# Patient Record
Sex: Male | Born: 1985 | Race: White
Health system: Southern US, Community
[De-identification: ages and names within clinical notes are randomized; demographics above are authoritative.]

---

## 2020-05-08 ENCOUNTER — Encounter (HOSPITAL_COMMUNITY): Payer: Self-pay | Admitting: Emergency Medicine

## 2020-05-08 ENCOUNTER — Emergency Department (HOSPITAL_COMMUNITY): Payer: 59

## 2020-05-08 ENCOUNTER — Other Ambulatory Visit: Payer: Self-pay

## 2020-05-08 ENCOUNTER — Emergency Department (HOSPITAL_COMMUNITY)
Admission: EM | Admit: 2020-05-08 | Discharge: 2020-05-08 | Disposition: A | Discharge status: Home / Self Care | Payer: 59 | Attending: Emergency Medicine | Admitting: Emergency Medicine

## 2020-05-08 DIAGNOSIS — Y999 Unspecified external cause status: Secondary | ICD-10-CM | POA: Diagnosis not present

## 2020-05-08 DIAGNOSIS — W1789XA Other fall from one level to another, initial encounter: Secondary | ICD-10-CM | POA: Diagnosis not present

## 2020-05-08 DIAGNOSIS — Y939 Activity, unspecified: Secondary | ICD-10-CM | POA: Insufficient documentation

## 2020-05-08 DIAGNOSIS — Y92812 Truck as the place of occurrence of the external cause: Secondary | ICD-10-CM | POA: Diagnosis not present

## 2020-05-08 DIAGNOSIS — S20212A Contusion of left front wall of thorax, initial encounter: Secondary | ICD-10-CM | POA: Diagnosis not present

## 2020-05-08 DIAGNOSIS — S299XXA Unspecified injury of thorax, initial encounter: Secondary | ICD-10-CM | POA: Diagnosis present

## 2020-05-08 MED ORDER — DICLOFENAC SODIUM 1 % EX GEL: 2.0000 g | Freq: Four times a day (QID) | CUTANEOUS | 0 refills | Status: AC | PRN

## 2020-05-08 NOTE — ED Provider Notes (Signed)
Emergency Department Provider Note   I have reviewed the triage vital signs and the nursing notes.   HISTORY  Chief Complaint Back Pain   HPI Todd Nolan is a 34 y.o. male presents to the emergency department for evaluation of left posterior chest wall pain after falling from a truck 2 days ago.  Patient was standing on the truck bed when he lost his footing and fell landing against a 2 x 4 leaning out of the truck he was in.  He denies any head injury or loss of consciousness.  He said soreness and bruising over the left chest.  He denies any fevers.  He reports pain with taking a deep breath and that he has been taking smaller breaths because of this.  With persistent pain he decided to present to the emergency department.  No sudden worsening symptoms. Pain is moderate and worse with touching/moving the area. No radiation.    History reviewed. No pertinent past medical history.  There are no problems to display for this patient.   History reviewed. No pertinent surgical history.  Allergies Patient has no allergy information on record.  No family history on file.  Social History Social History   Tobacco Use  . Smoking status: Not on file  Substance Use Topics  . Alcohol use: Not Currently  . Drug use: Not on file    Review of Systems  Constitutional: No fever/chills Cardiovascular: Positive chest pain. Respiratory: Denies shortness of breath. Gastrointestinal: No abdominal pain.   Musculoskeletal: Negative for back pain. Positive left chest wall pain.    ____________________________________________   PHYSICAL EXAM:  VITAL SIGNS: ED Triage Vitals  Enc Vitals Group     BP 05/08/20 0857 (!) 140/96     Pulse Rate 05/08/20 0857 85     Resp 05/08/20 0857 16     Temp 05/08/20 0857 97.9 F (36.6 C)     Temp Source 05/08/20 0857 Oral     SpO2 05/08/20 0857 100 %   Constitutional: Alert and oriented. Well appearing and in no acute distress. Eyes:  Conjunctivae are normal. Head: Atraumatic. Nose: No congestion/rhinnorhea. Mouth/Throat: Mucous membranes are moist. Neck: No stridor.  Cardiovascular: Normal rate, regular rhythm. Good peripheral circulation. Grossly normal heart sounds.   Respiratory: Normal respiratory effort.  No retractions. Lungs CTAB. Gastrointestinal: Soft and nontender. No LUQ tenderness specifically. No distention.  Musculoskeletal: No gross deformities of extremities.  Tenderness over the left posterior chest wall with associated mild abrasion.  No crepitus or paradoxical movement.  No lateral chest wall or anterior chest wall discomfort.  Neurologic:  Normal speech and language.  Skin:  Skin is warm, dry and intact. Abrasion to the left posterior chest wall.     ____________________________________________  RADIOLOGY  DG Ribs Unilateral W/Chest Left  Result Date: 05/08/2020 CLINICAL DATA:  34 year old male with a history of fall EXAM: LEFT RIBS AND CHEST - 3+ VIEW COMPARISON:  None. FINDINGS: No fracture or other bone lesions are seen involving the ribs. There is no evidence of pneumothorax or pleural effusion. Both lungs are clear. Heart size and mediastinal contours are within normal limits. IMPRESSION: Negative. Electronically Signed   By: Gilmer Mor D.O.   On: 05/08/2020 09:47    ____________________________________________   PROCEDURES  Procedure(s) performed:   Procedures  None  ____________________________________________   INITIAL IMPRESSION / ASSESSMENT AND PLAN / ED COURSE  Pertinent labs & imaging results that were available during my care of the patient were reviewed by  me and considered in my medical decision making (see chart for details).   Patient presents to the emergency department with left posterior chest wall pain after injury 2 days ago.  He has no anterior abdominal discomfort to suspect splenic or retroperitoneal injury/bleeding.  No flank bruising.  The patient's pain is  isolated over an area of abrasion to the posterior chest wall.  Plain films of the left ribs and chest show no pneumothorax or obvious displaced fracture.  Discussed over-the-counter medications with the patient along with Voltaren which was provided as a prescription.  Patient sent home with incentive spirometer with instructions to use at least once every hour while awake.  Also provided contact information for PCP along with ED return precautions.    ____________________________________________  FINAL CLINICAL IMPRESSION(S) / ED DIAGNOSES  Final diagnoses:  Contusion of rib on left side, initial encounter     NEW OUTPATIENT MEDICATIONS STARTED DURING THIS VISIT:  New Prescriptions   DICLOFENAC SODIUM (VOLTAREN) 1 % GEL    Apply 2 g topically 4 (four) times daily as needed.    Note:  This document was prepared using Dragon voice recognition software and may include unintentional dictation errors.  Alona Bene, MD, [facility]    Slayden Mennenga, Arlyss Repress, MD 05/08/20 1009

## 2020-05-08 NOTE — Discharge Instructions (Signed)
You were seen in the emergency room today with injury to your chest wall.  The x-ray today did not show any broken ribs.  I am you home with an incentive spirometer to use at least once every hour while awake to help prevent developing pneumonia.  Please use over-the-counter Tylenol and/or Motrin as needed for pain along with the topical prescription medicine provided as needed.  Please establish care with a primary care doctor.  Return with any fever, shortness of breath, worsening chest pain, or other sudden severe symptoms.

## 2020-05-08 NOTE — ED Triage Notes (Signed)
Per pt, states he fell onto the bed of a truck about 2 days-complaining of left flank/back pain

## 2021-06-02 IMAGING — CR DG RIBS W/ CHEST 3+V*L*
5 series · 5 of 5 positions shown · non-contrast
Comparison: None.

CLINICAL DATA: 33-year-old male with a history of fall

EXAM:
LEFT RIBS AND CHEST - 3+ VIEW

[w chest pa]
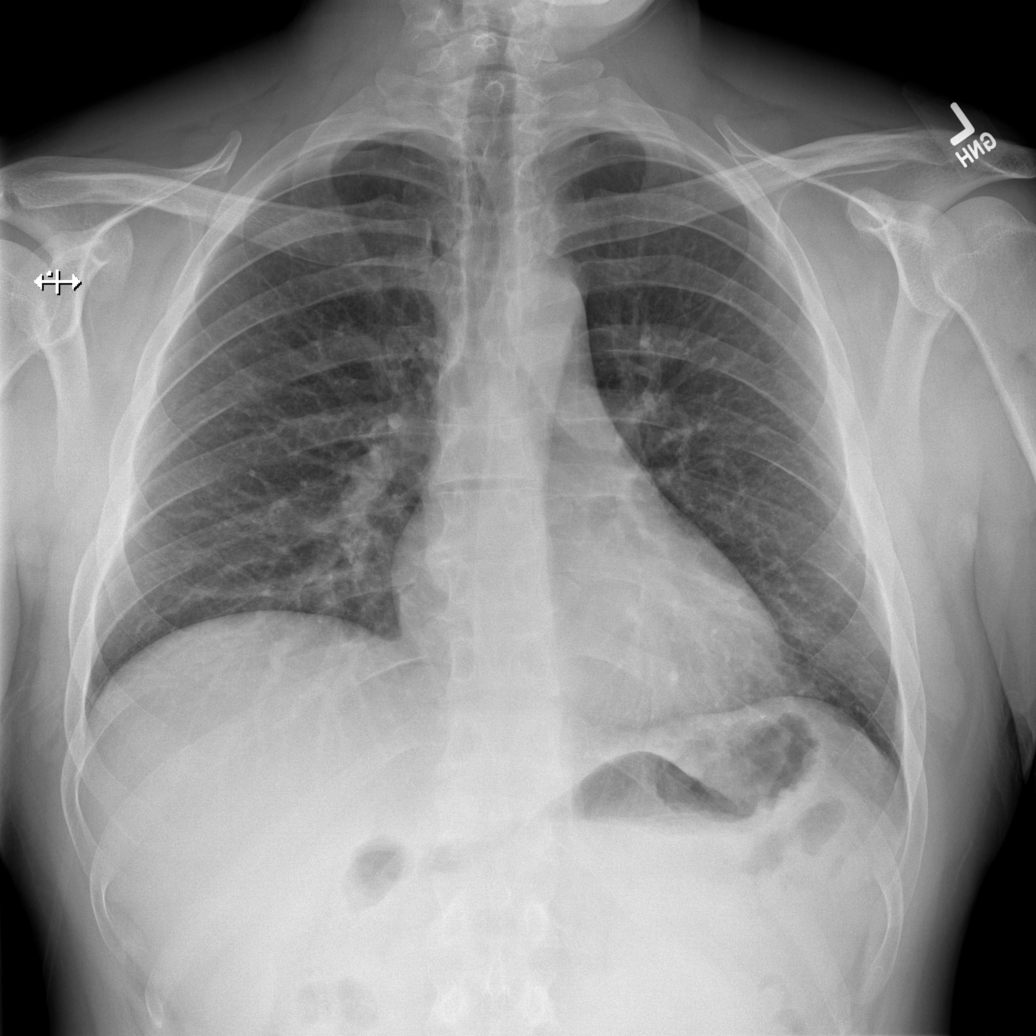

[w ribs ap upper left]
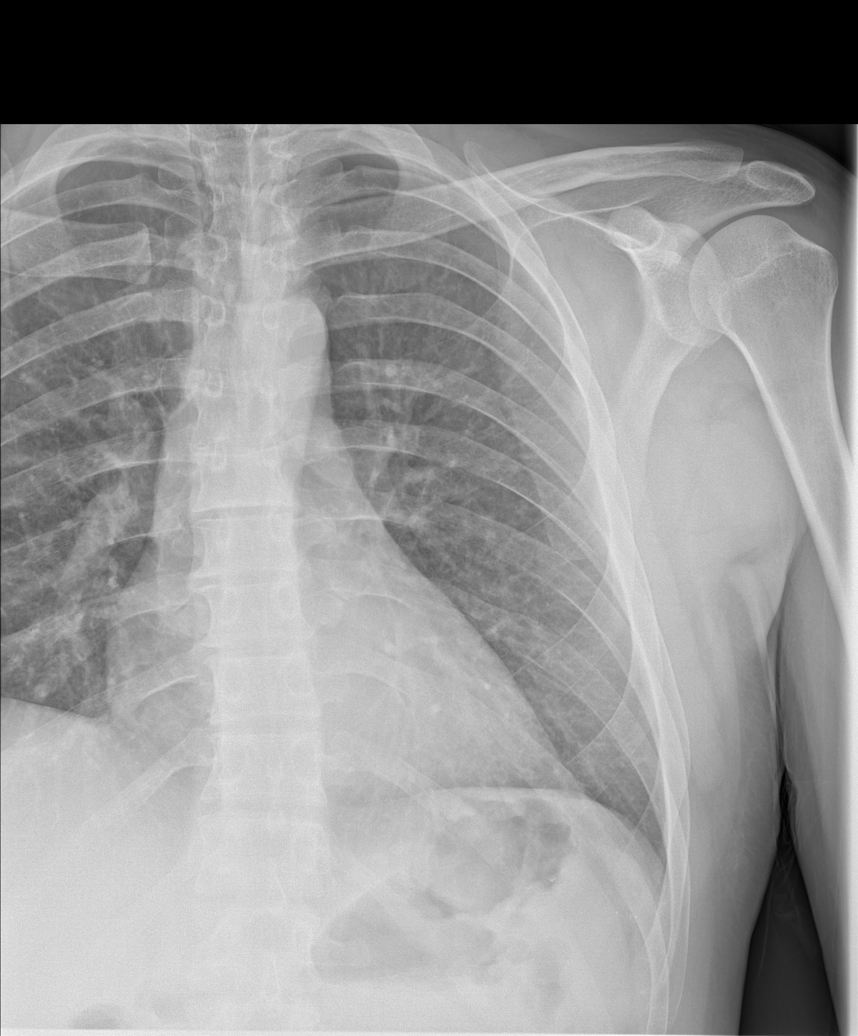

[w ribs ap lower left]
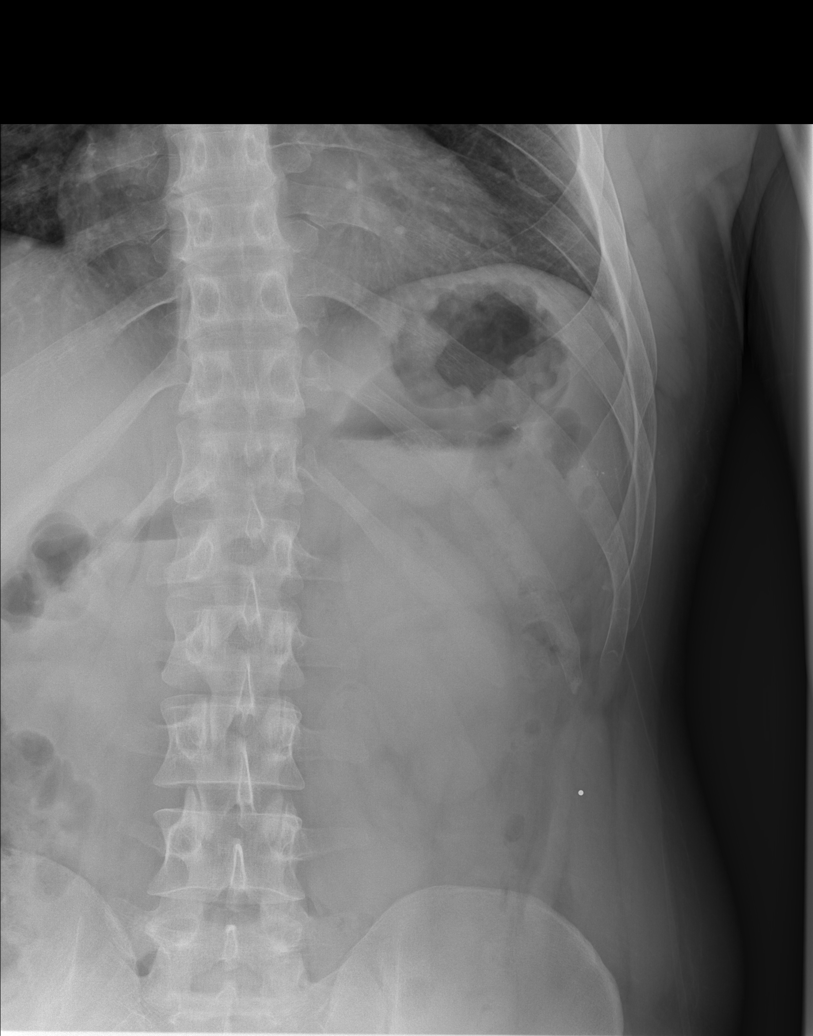

[w ribs obl left (1 of 2)]
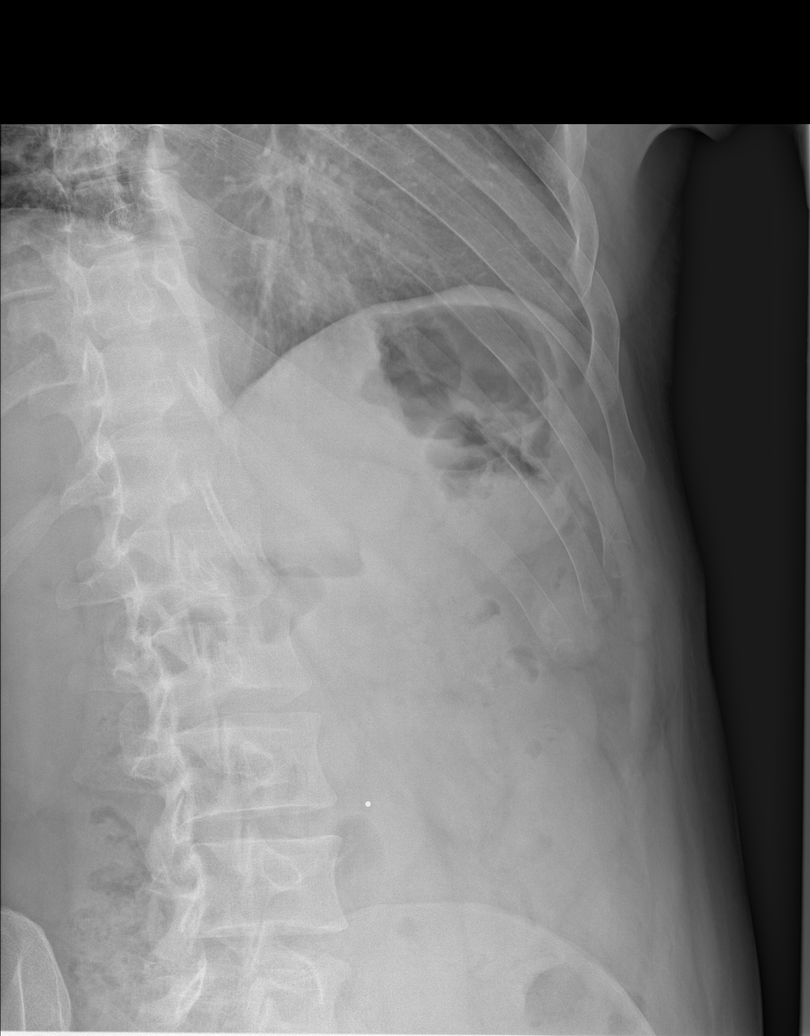

[w ribs obl left (2 of 2)]
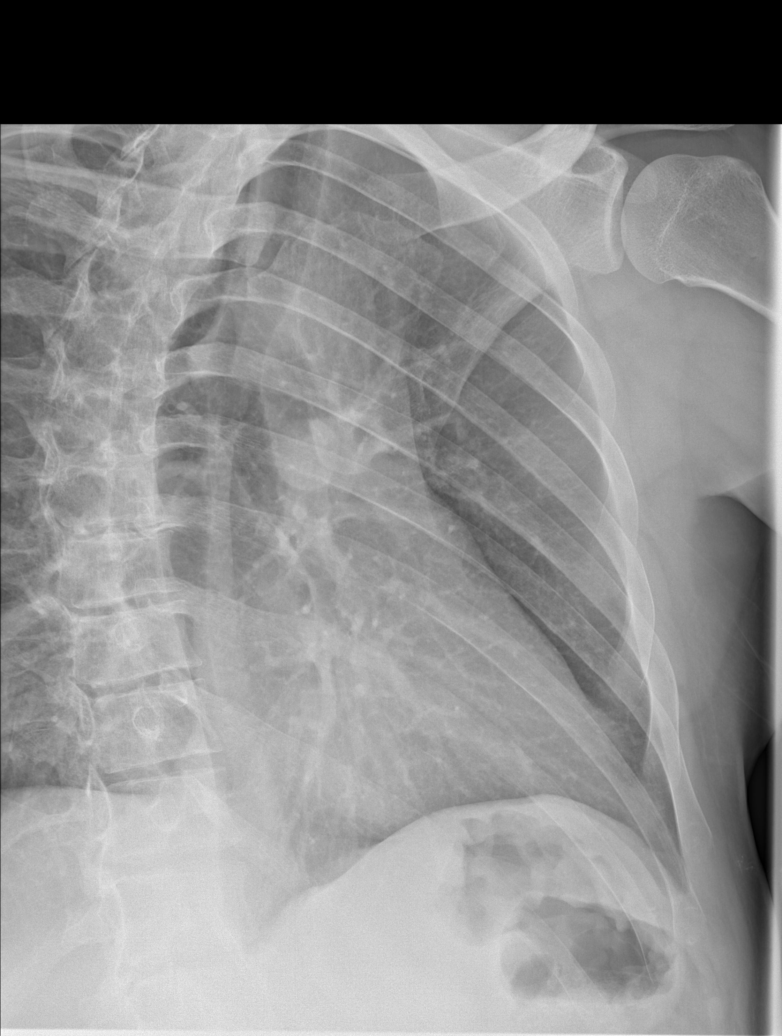

[5 of 5 positions shown; findings below may reference images not displayed]

FINDINGS: No fracture or other bone lesions are seen involving the ribs. There
is no evidence of pneumothorax or pleural effusion. Both lungs are
clear. Heart size and mediastinal contours are within normal limits.
IMPRESSION: Negative.

## 2021-10-17 ENCOUNTER — Emergency Department
Admission: EM | Admit: 2021-10-17 | Discharge: 2021-10-17 | Disposition: A | Discharge status: Home / Self Care | Payer: Worker's Comp, Other unspecified | Attending: Emergency Medicine | Admitting: Emergency Medicine

## 2021-10-17 DIAGNOSIS — T1501XA Foreign body in cornea, right eye, initial encounter: Secondary | ICD-10-CM

## 2021-10-17 MED ORDER — ERYTHROMYCIN 5 MG/GM OP OINT
TOPICAL_OINTMENT | Freq: Once | OPHTHALMIC | Status: AC
Start: 2021-10-17 — End: 2021-10-17
  Administered 2021-10-17: 1 g via OPHTHALMIC
  Filled 2021-10-17: qty 1

## 2021-10-17 MED ORDER — ERYTHROMYCIN 5 MG/GM OP OINT
TOPICAL_OINTMENT | Freq: Four times a day (QID) | OPHTHALMIC | 0 refills | Status: AC
Start: 2021-10-17 — End: 2021-10-22

## 2021-10-17 MED ORDER — HYDROCODONE-ACETAMINOPHEN 5-325 MG PO TABS
1.0000 | ORAL_TABLET | Freq: Once | ORAL | Status: AC
Start: 2021-10-17 — End: 2021-10-17
  Administered 2021-10-17: 1 via ORAL
  Filled 2021-10-17: qty 1

## 2021-10-17 MED ORDER — FLUORESCEIN SODIUM 1 MG OP STRP
1.0000 | ORAL_STRIP | Freq: Once | OPHTHALMIC | Status: AC
Start: 2021-10-17 — End: 2021-10-17
  Administered 2021-10-17: 1 via OPHTHALMIC
  Filled 2021-10-17: qty 1

## 2021-10-17 MED ORDER — TETRACAINE HCL 0.5 % OP SOLN
1.0000 [drp] | Freq: Once | OPHTHALMIC | Status: AC
Start: 2021-10-17 — End: 2021-10-17
  Administered 2021-10-17: 1 [drp] via OPHTHALMIC
  Filled 2021-10-17: qty 4

## 2021-10-17 MED ORDER — HYDROCODONE-ACETAMINOPHEN 5-325 MG PO TABS
1.0000 | ORAL_TABLET | Freq: Four times a day (QID) | ORAL | 0 refills | Status: AC | PRN
Start: 2021-10-17 — End: 2021-10-24

## 2021-10-18 NOTE — ED Provider Notes (Signed)
History     Chief Complaint   Patient presents with    Eye Injury     The history is provided by the patient.   Eye Injury  This is a new (R eye pain, erythema, tearing; likely from metal dust that was on his face after work) problem. The current episode started 12 to 24 hours ago. The problem occurs constantly. The problem has been gradually worsening. Nothing relieves the symptoms. Treatments tried: rinsed out eye last night and this morning. The treatment provided no relief.      History reviewed. No pertinent past medical history.    History reviewed. No pertinent surgical history.    History reviewed. No pertinent family history.    Social  Social History     Tobacco Use    Smoking status: Every Day     Packs/day: 1.00     Types: Cigarettes    Smokeless tobacco: Never   Vaping Use    Vaping Use: Never used   Substance Use Topics    Alcohol use: Not Currently    Drug use: Never       .     No Known Allergies    Home Medications       Med List Status: In Progress Set By: Samara Snide, RN at 10/17/2021 10:55 AM      No Medications             Review of Systems   HENT:  Negative for facial swelling.    Eyes:  Positive for pain (worst in upper outer eyelid) and redness. Negative for photophobia and visual disturbance.     Physical Exam    BP: 136/90, Heart Rate: 100, Temp: 99.3 F (37.4 C), Resp Rate: 16, SpO2: 99 %, Weight: 91.9 kg    Physical Exam  Vitals and nursing note reviewed.   Constitutional:       General: He is not in acute distress.     Appearance: Normal appearance.   HENT:      Head: Normocephalic and atraumatic.   Eyes:      General: Lids are normal.         Right eye: Foreign body present.      Extraocular Movements: Extraocular movements intact.      Conjunctiva/sclera:      Right eye: Right conjunctiva is injected.     Neck:      Vascular: No JVD.   Cardiovascular:      Rate and Rhythm: Normal rate and regular rhythm.      Pulses: Normal pulses.   Pulmonary:      Effort: Pulmonary effort is  normal. No respiratory distress.   Musculoskeletal:         General: Normal range of motion.      Cervical back: Normal range of motion and neck supple. No spinous process tenderness or muscular tenderness.   Skin:     General: Skin is warm and dry.   Neurological:      General: No focal deficit present.      Mental Status: He is alert and oriented to person, place, and time.      GCS: GCS eye subscore is 4. GCS verbal subscore is 5. GCS motor subscore is 6.   Psychiatric:         Mood and Affect: Mood normal.         Behavior: Behavior normal.         MDM and ED Course  ED Medication Orders (From admission, onward)      Start Ordered     Status Ordering Provider    10/17/21 1215 10/17/21 1208  erythromycin [facility]) ophthalmic ointment  Once        Route: Right Eye     Last MAR action: Given Isaiha Asare A    10/17/21 1215 10/17/21 1208  HYDROcodone-acetaminophen (NORCO) 5-325 MG per tablet 1 tablet  Once        Route: Oral  Ordered Dose: 1 tablet     Last MAR action: Given Isabel Ardila A    10/17/21 1115 10/17/21 1110  tetracaine (PONTOCAINE) 0.5 % ophthalmic solution 1 drop  Once        Route: Right Eye  Ordered Dose: 1 drop     Last MAR action: Given by Other Ayari Liwanag A    10/17/21 1115 10/17/21 1110  fluorescein ophthalmic strip 1 strip  Once        Route: Right Eye  Ordered Dose: 1 strip     Last MAR action: Given by Other Deano Tomaszewski A               MDM     Amount and/or Complexity of Data Reviewed  Discuss the patient with other providers: yes    I, Kalman Drape, MD MD, have been the primary provider for Nathan Thompson during this Emergency Dept visit.    When I was within 6 feet of this patient I donned the following PPE:  Surgical Mask Yes, Gloves Yes, Gown No  ; Goggles No  ; Face Shield No  , 32M 6000 Respirator No  ; N95 No  .  The patient was wearing a mask during my evaluation Yes.    Oxygen saturation by pulse oximetry is 95%-100% on RA, Normal.  Interventions: Patient Observed.    Part of metal  FB able to be removed by me but not all.  D/w Dr. Lilia Argue, Ophtho.  He agreed with erythromycin ointment and will see pt in office on Monday.      Strongly advised pt to f/u w/ Ophtho in 2 days to complete removal and discussed risk of rust ring if not completely removed.           Foreign Body    Date/Time: 10/18/2021 8:35 PM  Performed by: Kalman Drape, MD  Authorized by: Kalman Drape, MD   Consent: Verbal consent obtained.  Body area: eye  Location details: right cornea    Anesthesia:  Local Anesthetic: tetracaine drops  Patient cooperative: yes  Localization method: visualized  Removal mechanism: moist cotton swab  Eye examined with fluorescein.  No fluorescein uptake.  No residual rust ring present.  Dressing: antibiotic drops  Depth: embedded (75% of FB removed but small speck embedded)  Post-procedure assessment: residual foreign bodies remain  Patient tolerance: patient tolerated the procedure well with no immediate complications      Clinical Impression & Disposition     Clinical Impression  Final diagnoses:   Foreign body in cornea, right, initial encounter        ED Disposition       ED Disposition   Discharge    Condition   --    Date/Time   Sat Oct 17, 2021 12:42 PM    Comment   Nathan Thompson discharge to home/self care.    Condition at disposition: Stable  Discharge Medication List as of 10/17/2021 12:42 PM        START taking these medications    Details   erythromycin [facility]) ophthalmic ointment Place into the right eye every 6 (six) hours for 5 days Apply small ribbon to lower lid 4 times a day for 5 days, Starting Sat 10/17/2021, Until Thu 10/22/2021, E-Rx      HYDROcodone-acetaminophen (NORCO) 5-325 MG per tablet Take 1 tablet by mouth every 6 (six) hours as needed for Pain, Starting Sat 10/17/2021, Until Sat 10/24/2021 at 2359, E-Rx                         Kalman Drape, MD  10/18/21 2104
